# Patient Record
Sex: Female | Born: 1965 | Race: White | Hispanic: No | Marital: Married | Smoking: Never smoker
Health system: Southern US, Community
[De-identification: ages and names within clinical notes are randomized; demographics above are authoritative.]

## PROBLEM LIST (undated history)

## (undated) HISTORY — PX: BREAST LUMPECTOMY: SHX2

---

## 1998-03-02 ENCOUNTER — Inpatient Hospital Stay (HOSPITAL_COMMUNITY): Admission: AD | Admit: 1998-03-02 | Discharge: 1998-03-04 | Payer: Self-pay | Admitting: Obstetrics and Gynecology

## 2001-04-25 ENCOUNTER — Other Ambulatory Visit: Admission: RE | Admit: 2001-04-25 | Discharge: 2001-04-25 | Payer: Self-pay | Admitting: *Deleted

## 2001-08-09 ENCOUNTER — Other Ambulatory Visit: Admission: RE | Admit: 2001-08-09 | Discharge: 2001-08-09 | Payer: Self-pay | Admitting: *Deleted

## 2002-05-07 ENCOUNTER — Other Ambulatory Visit: Admission: RE | Admit: 2002-05-07 | Discharge: 2002-05-07 | Payer: Self-pay | Admitting: Obstetrics and Gynecology

## 2003-10-02 ENCOUNTER — Other Ambulatory Visit: Admission: RE | Admit: 2003-10-02 | Discharge: 2003-10-02 | Payer: Self-pay | Admitting: Obstetrics and Gynecology

## 2005-01-26 ENCOUNTER — Encounter: Admission: RE | Admit: 2005-01-26 | Discharge: 2005-01-26 | Payer: Self-pay | Admitting: Obstetrics and Gynecology

## 2005-01-26 ENCOUNTER — Encounter (INDEPENDENT_AMBULATORY_CARE_PROVIDER_SITE_OTHER): Payer: Self-pay | Admitting: *Deleted

## 2005-02-22 ENCOUNTER — Encounter (INDEPENDENT_AMBULATORY_CARE_PROVIDER_SITE_OTHER): Payer: Self-pay | Admitting: *Deleted

## 2005-02-22 ENCOUNTER — Ambulatory Visit (HOSPITAL_BASED_OUTPATIENT_CLINIC_OR_DEPARTMENT_OTHER): Admission: RE | Admit: 2005-02-22 | Discharge: 2005-02-22 | Payer: Self-pay | Admitting: General Surgery

## 2005-02-22 ENCOUNTER — Ambulatory Visit (HOSPITAL_COMMUNITY): Admission: RE | Admit: 2005-02-22 | Discharge: 2005-02-22 | Payer: Self-pay | Admitting: General Surgery

## 2006-02-08 ENCOUNTER — Encounter: Admission: RE | Admit: 2006-02-08 | Discharge: 2006-02-08 | Payer: Self-pay | Admitting: Obstetrics and Gynecology

## 2008-01-04 ENCOUNTER — Encounter: Admission: RE | Admit: 2008-01-04 | Discharge: 2008-01-04 | Payer: Self-pay | Admitting: Obstetrics and Gynecology

## 2008-01-15 ENCOUNTER — Encounter: Admission: RE | Admit: 2008-01-15 | Discharge: 2008-01-15 | Payer: Self-pay | Admitting: Obstetrics and Gynecology

## 2009-01-27 ENCOUNTER — Encounter: Admission: RE | Admit: 2009-01-27 | Discharge: 2009-01-27 | Payer: Self-pay | Admitting: Obstetrics and Gynecology

## 2009-08-06 ENCOUNTER — Encounter: Admission: RE | Admit: 2009-08-06 | Discharge: 2009-08-06 | Payer: Self-pay | Admitting: Obstetrics and Gynecology

## 2010-05-10 ENCOUNTER — Encounter: Admission: RE | Admit: 2010-05-10 | Discharge: 2010-05-10 | Payer: Self-pay | Admitting: Obstetrics and Gynecology

## 2010-10-22 NOTE — Op Note (Signed)
NAME:  Colleen Burke, Colleen Burke               ACCOUNT NO.:  [id number]   MEDICAL RECORD NO.:  [id number]          PATIENT TYPE:  AMB   LOCATION:  DSC                          FACILITY:  [facility]   PHYSICIAN:  Rose Phi. Maple Hudson, M.D.   DATE OF BIRTH:  April 27, 1966   DATE OF PROCEDURE:  02/22/2005  DATE OF DISCHARGE:                                 OPERATIVE REPORT   PREOPERATIVE DIAGNOSIS:  Phyllodes tumor of the left breast.   POSTOPERATIVE DIAGNOSIS:  Phyllodes tumor of the left breast.   OPERATION PERFORMED:  Excision of left breast mass.   SURGEON:  Rose Phi. Maple Hudson, M.D.   ANESTHESIA:  General.   DESCRIPTION OF PROCEDURE:  After suitable general anesthesia was induced,  the patient was placed in supine position with the arms extended on the arm  board.  A curved linear incision overlying the palpable mass was outlined  with a marking pencil and the area infiltrated with 0.25% Marcaine.  Incision was made and excision of the mass was carried out.  Because it was  a phyllodes tumor, I wanted a margin.  Hemostasis was obtained with the  cautery.  The deeper tissue was then approximated with 3-0 Vicryl and the  skin was closed with subcuticular 4-0 Monocryl with Steri-Strips.  Dressing  applied.  The patient was then transferred to the recovery room in  satisfactory condition having tolerated the procedure well.      Rose Phi. Maple Hudson, M.D.  Electronically Signed     PRY/MEDQ  D:  02/22/2005  T:  02/22/2005  Job:  [id number]

## 2013-08-20 ENCOUNTER — Other Ambulatory Visit: Payer: Self-pay | Admitting: Family Medicine

## 2013-08-20 ENCOUNTER — Other Ambulatory Visit (HOSPITAL_COMMUNITY)
Admission: RE | Admit: 2013-08-20 | Discharge: 2013-08-20 | Disposition: A | Discharge status: Home / Self Care | Payer: 59 | Source: Ambulatory Visit | Attending: Family Medicine | Admitting: Family Medicine

## 2013-08-20 DIAGNOSIS — N63 Unspecified lump in unspecified breast: Secondary | ICD-10-CM

## 2013-08-20 DIAGNOSIS — Z124 Encounter for screening for malignant neoplasm of cervix: Secondary | ICD-10-CM | POA: Insufficient documentation

## 2013-08-23 ENCOUNTER — Ambulatory Visit
Admission: RE | Admit: 2013-08-23 | Discharge: 2013-08-23 | Disposition: A | Discharge status: Home / Self Care | Payer: 59 | Source: Ambulatory Visit | Attending: Family Medicine | Admitting: Family Medicine

## 2013-08-23 ENCOUNTER — Ambulatory Visit
Admission: RE | Admit: 2013-08-23 | Discharge: 2013-08-23 | Disposition: A | Discharge status: Home / Self Care | Payer: Self-pay | Source: Ambulatory Visit | Attending: Family Medicine | Admitting: Family Medicine

## 2013-08-23 DIAGNOSIS — N63 Unspecified lump in unspecified breast: Secondary | ICD-10-CM

## 2013-10-14 ENCOUNTER — Other Ambulatory Visit: Payer: Self-pay | Admitting: Dermatology

## 2014-04-09 ENCOUNTER — Other Ambulatory Visit: Payer: Self-pay | Admitting: Obstetrics and Gynecology

## 2014-04-22 NOTE — Consult Note (Deleted)
NAME:  Colleen Burke, Colleen Burke NO.:  [id number]  MEDICAL RECORD NO.:  [id number]  LOCATION:  [facility]                         FACILITY:  [facility]  PHYSICIAN:  Lovenia Kim, M.D.DATE OF BIRTH:  1965-10-02  DATE OF CONSULTATION: DATE OF DISCHARGE:                                HISTORY AND PHYSICAL   CHIEF COMPLAINT:  Menorrhagia.  HISTORY OF PRESENT ILLNESS:  A 48 year old white female, G3, P2 who presents with aforementioned indications secondary to failed ablation.  PAST MEDICAL HISTORY:  Hypertension and colon cancer.  SOCIAL HISTORY:  Noncontributory.  SURGICAL HISTORY:  Benign lumpectomy, laser ablation of the cervix.  PHYSICAL EXAMINATION:  GENERAL:  Well-developed, well-nourished, white female, in no acute distress. HEENT:  Normal. NECK:  Supple.  Full range of motion. LUNGS:  Clear. HEART:  Regular rate and rhythm. ABDOMEN:  Soft, nontender. PELVIC:  Reveals the uterus to be bulky, anteflexed with no adnexal masses. EXTREMITIES:  There are no cords. NEUROLOGIC:  Nonfocal. SKIN:  Intact.  IMPRESSION:  Symptomatic menorrhagia.  PLAN:  Proceed with diagnostic hysteroscopy, D and C, NovaSure endometrial ablation.  Risks of anesthesia, infection, bleeding, injury to surrounding organs, and possible need for repair is discussed. Delayed versus immediate complications to include bowel and bladder injury noted.  The patient acknowledges and wishes to proceed.     Lovenia Kim, M.D.    RJT/MEDQ  D:  04/22/2014  T:  04/22/2014  Job:  [id number]

## 2014-04-23 ENCOUNTER — Ambulatory Visit (HOSPITAL_COMMUNITY): Payer: 59 | Admitting: Anesthesiology

## 2014-04-23 ENCOUNTER — Ambulatory Visit (HOSPITAL_COMMUNITY)
Admission: RE | Admit: 2014-04-23 | Discharge: 2014-04-23 | Disposition: A | Discharge status: Home / Self Care | Payer: 59 | Source: Ambulatory Visit | Attending: Obstetrics and Gynecology | Admitting: Obstetrics and Gynecology

## 2014-04-23 ENCOUNTER — Encounter (HOSPITAL_COMMUNITY): Payer: Self-pay | Admitting: *Deleted

## 2014-04-23 ENCOUNTER — Encounter (HOSPITAL_COMMUNITY)
Admission: RE | Disposition: A | Discharge status: Home / Self Care | Payer: Self-pay | Source: Ambulatory Visit | Attending: Obstetrics and Gynecology

## 2014-04-23 DIAGNOSIS — I10 Essential (primary) hypertension: Secondary | ICD-10-CM | POA: Insufficient documentation

## 2014-04-23 DIAGNOSIS — N920 Excessive and frequent menstruation with regular cycle: Secondary | ICD-10-CM | POA: Insufficient documentation

## 2014-04-23 DIAGNOSIS — Z85038 Personal history of other malignant neoplasm of large intestine: Secondary | ICD-10-CM | POA: Insufficient documentation

## 2014-04-23 DIAGNOSIS — D649 Anemia, unspecified: Secondary | ICD-10-CM | POA: Insufficient documentation

## 2014-04-23 DIAGNOSIS — D259 Leiomyoma of uterus, unspecified: Secondary | ICD-10-CM | POA: Insufficient documentation

## 2014-04-23 HISTORY — PX: DILITATION & CURRETTAGE/HYSTROSCOPY WITH NOVASURE ABLATION: SHX5568

## 2014-04-23 LAB — CBC
HCT: 39.2 % (ref 36.0–46.0)
Hemoglobin: 13.1 g/dL (ref 12.0–15.0)
MCH: 31.9 pg (ref 26.0–34.0)
MCHC: 33.4 g/dL (ref 30.0–36.0)
MCV: 95.4 fL (ref 78.0–100.0)
PLATELETS: 308 10*3/uL (ref 150–400)
RBC: 4.11 MIL/uL (ref 3.87–5.11)
RDW: 12.9 % (ref 11.5–15.5)
WBC: 5.5 10*3/uL (ref 4.0–10.5)

## 2014-04-23 SURGERY — DILATATION & CURETTAGE/HYSTEROSCOPY WITH NOVASURE ABLATION
Anesthesia: General | Site: Vagina

## 2014-04-23 MED ORDER — BUPIVACAINE HCL (PF) 0.25 % IJ SOLN
INTRAMUSCULAR | Status: DC | PRN
  Administered 2014-04-23: 20 mL

## 2014-04-23 MED ORDER — KETOROLAC TROMETHAMINE 30 MG/ML IJ SOLN: 15.0000 mg | Freq: Once | INTRAMUSCULAR | Status: DC | PRN

## 2014-04-23 MED ORDER — PROPOFOL 10 MG/ML IV EMUL
INTRAVENOUS | Status: AC
  Filled 2014-04-23: qty 20

## 2014-04-23 MED ORDER — DEXAMETHASONE SODIUM PHOSPHATE 10 MG/ML IJ SOLN
INTRAMUSCULAR | Status: DC | PRN
  Administered 2014-04-23: 4 mg via INTRAVENOUS

## 2014-04-23 MED ORDER — BUPIVACAINE HCL (PF) 0.25 % IJ SOLN
INTRAMUSCULAR | Status: AC
  Filled 2014-04-23: qty 30

## 2014-04-23 MED ORDER — MIDAZOLAM HCL 2 MG/2ML IJ SOLN
INTRAMUSCULAR | Status: AC
  Filled 2014-04-23: qty 2

## 2014-04-23 MED ORDER — LIDOCAINE HCL (CARDIAC) 20 MG/ML IV SOLN
INTRAVENOUS | Status: AC
  Filled 2014-04-23: qty 5

## 2014-04-23 MED ORDER — SCOPOLAMINE 1 MG/3DAYS TD PT72
MEDICATED_PATCH | TRANSDERMAL | Status: AC
  Administered 2014-04-23: 1.5 mg via TRANSDERMAL
  Filled 2014-04-23: qty 1

## 2014-04-23 MED ORDER — LACTATED RINGERS IV SOLN
INTRAVENOUS | Status: DC
  Administered 2014-04-23 (×2): via INTRAVENOUS

## 2014-04-23 MED ORDER — PROPOFOL 10 MG/ML IV BOLUS
INTRAVENOUS | Status: DC | PRN
  Administered 2014-04-23: 170 mg via INTRAVENOUS

## 2014-04-23 MED ORDER — LIDOCAINE HCL (CARDIAC) 20 MG/ML IV SOLN
INTRAVENOUS | Status: DC | PRN
  Administered 2014-04-23: 70 mg via INTRAVENOUS

## 2014-04-23 MED ORDER — KETOROLAC TROMETHAMINE 30 MG/ML IJ SOLN
INTRAMUSCULAR | Status: AC
  Filled 2014-04-23: qty 1

## 2014-04-23 MED ORDER — ONDANSETRON HCL 4 MG/2ML IJ SOLN
INTRAMUSCULAR | Status: DC | PRN
  Administered 2014-04-23: 4 mg via INTRAVENOUS

## 2014-04-23 MED ORDER — FENTANYL CITRATE 0.05 MG/ML IJ SOLN
INTRAMUSCULAR | Status: DC | PRN
  Administered 2014-04-23 (×2): 50 ug via INTRAVENOUS

## 2014-04-23 MED ORDER — ONDANSETRON HCL 4 MG/2ML IJ SOLN
INTRAMUSCULAR | Status: AC
  Filled 2014-04-23: qty 2

## 2014-04-23 MED ORDER — FENTANYL CITRATE 0.05 MG/ML IJ SOLN
INTRAMUSCULAR | Status: AC
  Filled 2014-04-23: qty 2

## 2014-04-23 MED ORDER — KETOROLAC TROMETHAMINE 30 MG/ML IJ SOLN
INTRAMUSCULAR | Status: DC | PRN
  Administered 2014-04-23: 30 mg via INTRAVENOUS

## 2014-04-23 MED ORDER — FENTANYL CITRATE 0.05 MG/ML IJ SOLN: 25.0000 ug | INTRAMUSCULAR | Status: DC | PRN

## 2014-04-23 MED ORDER — SCOPOLAMINE 1 MG/3DAYS TD PT72
1.0000 | MEDICATED_PATCH | Freq: Once | TRANSDERMAL | Status: DC
  Administered 2014-04-23: 1.5 mg via TRANSDERMAL

## 2014-04-23 MED ORDER — DEXAMETHASONE SODIUM PHOSPHATE 4 MG/ML IJ SOLN
INTRAMUSCULAR | Status: AC
  Filled 2014-04-23: qty 1

## 2014-04-23 MED ORDER — EPHEDRINE SULFATE 50 MG/ML IJ SOLN
INTRAMUSCULAR | Status: DC | PRN
  Administered 2014-04-23: 10 mg via INTRAVENOUS

## 2014-04-23 MED ORDER — MIDAZOLAM HCL 2 MG/2ML IJ SOLN
INTRAMUSCULAR | Status: DC | PRN
  Administered 2014-04-23: 1 mg via INTRAVENOUS

## 2014-04-23 MED ORDER — LACTATED RINGERS IR SOLN
Status: DC | PRN
  Administered 2014-04-23: 3000 mL

## 2014-04-23 MED ORDER — MEPERIDINE HCL 25 MG/ML IJ SOLN: 6.2500 mg | INTRAMUSCULAR | Status: DC | PRN

## 2014-04-23 MED ORDER — CEFAZOLIN SODIUM-DEXTROSE 2-3 GM-% IV SOLR
INTRAVENOUS | Status: AC
  Filled 2014-04-23: qty 50

## 2014-04-23 MED ORDER — OXYCODONE-ACETAMINOPHEN 5-325 MG PO TABS: 1.0000 | ORAL_TABLET | ORAL | Status: DC | PRN

## 2014-04-23 MED ORDER — CEFAZOLIN SODIUM-DEXTROSE 2-3 GM-% IV SOLR
2.0000 g | INTRAVENOUS | Status: AC
  Administered 2014-04-23: 2 g via INTRAVENOUS

## 2014-04-23 MED ORDER — ONDANSETRON HCL 4 MG/2ML IJ SOLN: 4.0000 mg | Freq: Once | INTRAMUSCULAR | Status: DC | PRN

## 2014-04-23 SURGICAL SUPPLY — 13 items
ABLATOR ENDOMETRIAL BIPOLAR (ABLATOR) ×2 IMPLANT
CATH ROBINSON RED A/P 16FR (CATHETERS) ×2 IMPLANT
CLOTH BEACON ORANGE TIMEOUT ST (SAFETY) ×2 IMPLANT
CONTAINER PREFILL 10% NBF 60ML (FORM) ×2 IMPLANT
GLOVE BIO SURGEON STRL SZ7.5 (GLOVE) ×4 IMPLANT
GOWN STRL REUS W/TWL LRG LVL3 (GOWN DISPOSABLE) ×4 IMPLANT
PACK VAGINAL MINOR WOMEN LF (CUSTOM PROCEDURE TRAY) ×2 IMPLANT
PAD OB MATERNITY 4.3X12.25 (PERSONAL CARE ITEMS) ×2 IMPLANT
PAD PREP 24X48 CUFFED NSTRL (MISCELLANEOUS) ×2 IMPLANT
SET TUBING HYSTEROSCOPY 2 NDL (TUBING) ×2 IMPLANT
SYR TB 1ML 25GX5/8 (SYRINGE) IMPLANT
TOWEL OR 17X24 6PK STRL BLUE (TOWEL DISPOSABLE) ×4 IMPLANT
WATER STERILE IRR 1000ML POUR (IV SOLUTION) ×2 IMPLANT

## 2014-04-23 NOTE — Transfer of Care (Signed)
Immediate Anesthesia Transfer of Care Note  Patient: Colleen Burke  Procedure(s) Performed: Procedure(s): DILATATION & CURETTAGE/HYSTEROSCOPY WITH NOVASURE ABLATION (N/A)  Patient Location: PACU  Anesthesia Type:General  Level of Consciousness: awake, alert , oriented and patient cooperative  Airway & Oxygen Therapy: Patient Spontanous Breathing and Patient connected to nasal cannula oxygen  Post-op Assessment: Report given to PACU RN and Post -op Vital signs reviewed and stable  Post vital signs: Reviewed and stable  Complications: No apparent anesthesia complications

## 2014-04-23 NOTE — Op Note (Signed)
04/23/2014  10:48 AM  PATIENT:  Colleen Burke  48 y.o. female  PRE-OPERATIVE DIAGNOSIS:  Menorrhagia, Anemia  POST-OPERATIVE DIAGNOSIS:  Menorrhagia, Anemia  PROCEDURE:  Procedure(s): DILATATION & CURETTAGE/HYSTEROSCOPY WITH NOVASURE ABLATION  SURGEON:  Surgeon(s): Lovenia Kim, MD  ASSISTANTS: none   ANESTHESIA:   local and general  ESTIMATED BLOOD LOSS: minimal   DRAINS: none   LOCAL MEDICATIONS USED:  MARCAINE    and Amount: 20 ml  SPECIMEN:  Source of Specimen:  [facility]  DISPOSITION OF SPECIMEN:  PATHOLOGY  COUNTS:  YES  DICTATION #: [id number]  PLAN OF CARE: Dc homje  PATIENT DISPOSITION:  PACU - hemodynamically stable.

## 2014-04-23 NOTE — Anesthesia Preprocedure Evaluation (Signed)
Anesthesia Evaluation    Reviewed: Allergy & Precautions, H&P , NPO status , Patient's Chart, lab work & pertinent test results  Airway Mallampati: I  TM Distance: >3 FB Neck ROM: full    Dental no notable dental hx. (+) Teeth Intact   Pulmonary neg pulmonary ROS,    Pulmonary exam normal       Cardiovascular negative cardio ROS  Rhythm:regular     Neuro/Psych negative neurological ROS  negative psych ROS   GI/Hepatic negative GI ROS, Neg liver ROS,   Endo/Other  negative endocrine ROS  Renal/GU negative Renal ROS     Musculoskeletal   Abdominal Normal abdominal exam  (+)   Peds  Hematology negative hematology ROS (+)   Anesthesia Other Findings   Reproductive/Obstetrics negative OB ROS                             Anesthesia Physical Anesthesia Plan  ASA: II  Anesthesia Plan: General   Post-op Pain Management:    Induction: Intravenous  Airway Management Planned: LMA  Additional Equipment:   Intra-op Plan:   Post-operative Plan:   Informed Consent: I have reviewed the patients History and Physical, chart, labs and discussed the procedure including the risks, benefits and alternatives for the proposed anesthesia with the patient or authorized representative who has indicated his/her understanding and acceptance.   Dental Advisory Given  Plan Discussed with: CRNA and Surgeon  Anesthesia Plan Comments:         Anesthesia Quick Evaluation

## 2014-04-23 NOTE — H&P (Signed)
NAME:  Colleen Burke, Colleen Burke NO.:  [id number]  MEDICAL RECORD NO.:  [id number]  LOCATION:  [facility]                         FACILITY:  [facility]  PHYSICIAN:  Lovenia Kim, M.D.DATE OF BIRTH:  [birth year]  DATE OF ADMISSION:  03/14/2014 DATE OF DISCHARGE:                             HISTORY & PHYSICAL   CHIEF COMPLAINT:  Menorrhagia.  HISTORY OF PRESENT ILLNESS:  A 48 year old white female, G3, P2 who presents with aforementioned indications secondary to failed ablation.  PAST MEDICAL HISTORY:  Hypertension and colon cancer.  SOCIAL HISTORY:  Noncontributory __________.  SURGICAL HISTORY:  Benign lumpectomy, laser ablation of the cervix.  PHYSICAL EXAMINATION:  GENERAL:  Well-developed, well-nourished, white female, in no acute distress. HEENT:  Normal. NECK:  Supple.  Full range of motion. LUNGS:  Clear. HEART:  Regular rate and rhythm. ABDOMEN:  Soft, nontender. PELVIC:  Reveals the uterus to be bulky, anteflexed with no adnexal masses. EXTREMITIES:  There are no cords. NEUROLOGIC:  Nonfocal. SKIN:  Intact.  IMPRESSION:  Symptomatic menorrhagia.  PLAN:  Proceed with diagnostic hysteroscopy, D and C, NovaSure endometrial ablation.  Risks of anesthesia, infection, bleeding, injury to surrounding organs, and possible need for repair is discussed. Delayed versus immediate complications to include bowel and bladder injury noted.  The patient acknowledges and wishes to proceed.     Lovenia Kim, M.D.    RJT/MEDQ  D:  04/22/2014  T:  04/22/2014  Job:  [id number]

## 2014-04-23 NOTE — Progress Notes (Signed)
Patient ID: Colleen Burke, female   DOB: 1965/11/04, 48 y.o.   MRN: [id number] Patient seen and examined. Consent witnessed and signed. No changes noted. Update completed. CBC    Component Value Date/Time   WBC 5.5 04/23/2014 0925   RBC 4.11 04/23/2014 0925   HGB 13.1 04/23/2014 0925   HCT 39.2 04/23/2014 0925   PLT 308 04/23/2014 0925   MCV 95.4 04/23/2014 0925   MCH 31.9 04/23/2014 0925   MCHC 33.4 04/23/2014 0925   RDW 12.9 04/23/2014 0925

## 2014-04-23 NOTE — Op Note (Signed)
NAME:  Colleen Burke, Colleen Burke NO.:  [id number]  MEDICAL RECORD NO.:  [id number]  LOCATION:  [facility]                          FACILITY:  [facility]  PHYSICIAN:  Lovenia Kim, M.D.DATE OF BIRTH:  May 27, 1966  DATE OF PROCEDURE: DATE OF DISCHARGE:                              OPERATIVE REPORT   PREOPERATIVE DIAGNOSES: 1. Refractory menorrhagia. 2. Uterine fibroid. 3. Secondary anemia.  POSTOPERATIVE DIAGNOSES: 1. Refractory menorrhagia. 2. Uterine fibroid. 3. Secondary anemia.  PROCEDURE: 1. Diagnostic hysteroscopy. 2. Dilation and curettage. 3. NovaSure endometrial ablation.  SURGEON:  Brien Few, M.D.  ASSISTANT:  None.  ANESTHESIA:  General and local.  ESTIMATED BLOOD LOSS:  Less than 50 mL.  FLUID DEFICIT:  150 mL.  COMPLICATIONS:  None.  DRAINS:  None.  COUNTS:  Correct.  DISPOSITION:  The patient to recovery in good condition.  SPECIMEN:  Endometrial curettings to pathology.  BRIEF OPERATIVE NOTE:  After being apprised of risks of anesthesia, infection, bleeding, injury to surrounding organs, possible need for repair, delayed versus immediate complications to include bowel and bladder injury, possible need for repair, the patient was brought to the operating room where she was administered general anesthetic without complications.  Prepped and draped in usual sterile fashion. Catheterized until the bladder was empty.  Exam under anesthesia revealed a anteflexed irregularly shaped known fibroid uterus.  No adnexal masses.  At this time, a dilute Marcaine solution was placed, 20 mL paracervical block and cervix was easily dilated up to a #21 Pratt dilator.  Hysteroscope placed.  Visualization reveals an otherwise normal endometrial cavity with evidence of fibroid bulging on the anterior portion, but no entry into the cavity itself.  Bilateral normal tubal ostia and no evidence of endometrial pathology.  D and C was performed using sharp  curettage in a 4-quadrant method.  NovaSure device was placed and seated in the standard fashion to a length of 6.5, a width of 4.4, and initiated after negative CO2 test to a wattage of 157 watts for 50 seconds.  Without difficulty, the device was removed, inspected, and found to be intact.  Revisualization of the endometrial cavity revealed a well ablated global endometrial ablation.  No evidence of uterine perforation.  All instruments were removed.  There was a small posterior cervical ulceration from tenaculum placement.  This was stitched using an interrupted 2-0 Vicryl suture otherwise good hemostasis noted.  The patient tolerated the procedure well, was awakened and transferred to recovery in good condition.     Lovenia Kim, M.D.     RJT/MEDQ  D:  04/23/2014  T:  04/23/2014  Job:  [id number]

## 2014-04-23 NOTE — Discharge Instructions (Signed)

## 2014-04-24 ENCOUNTER — Encounter (HOSPITAL_COMMUNITY): Payer: Self-pay | Admitting: Obstetrics and Gynecology

## 2014-04-24 NOTE — Anesthesia Postprocedure Evaluation (Signed)
Anesthesia Post Note  Patient: Colleen Burke  Procedure(s) Performed: Procedure(s) (LRB): DILATATION & CURETTAGE/HYSTEROSCOPY WITH NOVASURE ABLATION (N/A)  Anesthesia type: General  Patient location: PACU  Post pain: Pain level controlled  Post assessment: Post-op Vital signs reviewed  Last Vitals:  Filed Vitals:   04/23/14 1200  BP: 127/83  Pulse: 50  Temp: 36.6 C  Resp: 16    Post vital signs: Reviewed  Level of consciousness: sedated  Complications: No apparent anesthesia complications

## 2015-10-05 DIAGNOSIS — L573 Poikiloderma of Civatte: Secondary | ICD-10-CM | POA: Diagnosis not present

## 2015-10-05 DIAGNOSIS — D225 Melanocytic nevi of trunk: Secondary | ICD-10-CM | POA: Diagnosis not present

## 2015-10-05 DIAGNOSIS — L821 Other seborrheic keratosis: Secondary | ICD-10-CM | POA: Diagnosis not present

## 2015-10-05 DIAGNOSIS — L719 Rosacea, unspecified: Secondary | ICD-10-CM | POA: Diagnosis not present

## 2015-10-05 DIAGNOSIS — L710 Perioral dermatitis: Secondary | ICD-10-CM | POA: Diagnosis not present

## 2015-10-05 DIAGNOSIS — D485 Neoplasm of uncertain behavior of skin: Secondary | ICD-10-CM | POA: Diagnosis not present

## 2015-10-05 DIAGNOSIS — D2272 Melanocytic nevi of left lower limb, including hip: Secondary | ICD-10-CM | POA: Diagnosis not present

## 2016-02-29 DIAGNOSIS — S86811A Strain of other muscle(s) and tendon(s) at lower leg level, right leg, initial encounter: Secondary | ICD-10-CM | POA: Diagnosis not present

## 2016-03-14 ENCOUNTER — Ambulatory Visit (INDEPENDENT_AMBULATORY_CARE_PROVIDER_SITE_OTHER): Payer: BLUE CROSS/BLUE SHIELD | Admitting: Sports Medicine

## 2016-03-14 DIAGNOSIS — S86811D Strain of other muscle(s) and tendon(s) at lower leg level, right leg, subsequent encounter: Secondary | ICD-10-CM

## 2016-03-28 ENCOUNTER — Ambulatory Visit (INDEPENDENT_AMBULATORY_CARE_PROVIDER_SITE_OTHER): Payer: BLUE CROSS/BLUE SHIELD | Admitting: Sports Medicine

## 2016-03-28 ENCOUNTER — Inpatient Hospital Stay (INDEPENDENT_AMBULATORY_CARE_PROVIDER_SITE_OTHER): Payer: BLUE CROSS/BLUE SHIELD

## 2016-03-28 VITALS — BP 133/92 | HR 53 | Ht 62.5 in | Wt 155.0 lb

## 2016-03-28 DIAGNOSIS — S86811D Strain of other muscle(s) and tendon(s) at lower leg level, right leg, subsequent encounter: Secondary | ICD-10-CM

## 2016-03-28 NOTE — Progress Notes (Signed)
Office Visit Note   Patient: Colleen Burke           Date of Birth: [birth year]           MRN: [id number] Visit Date: 03/28/2016  PCP: Reginia Naas, MD  Assessment & Plan: Visit Diagnoses:  1. Strain of calf muscle, right, subsequent encounter    Plan: Doing well. Progress to out of boot around the house.  Stay in immobilization for an additional 2 weeks when active. Repeat US at f/u and consider referal to PT at that time with addition of Alfredson Exercises. Continue Nitro Protocol  Follow-Up Instructions: Return in about 2 weeks (around 04/11/2016) for repeat ultrasound.   Orders:  Orders Placed This Encounter  Procedures  . [place] Extrem Low Right Ltd   Meds ordered this encounter  Medications  . nitroGLYCERIN (NITRODUR - DOSED IN MG/24 HR) 0.2 mg/hr patch    Refill:  0    Procedures: No procedures performed  Clinical Data: Findings:  Calf injury on 03/02/16 while playing tennis.    Subjective: Chief Complaint  Patient presents with  . Follow-up    Right calf- Pt. wearing Fracture boot, Pt. states about the same   Patient improving. Persistent moderate pain when out of the fracture boot has been compliant with this. She denies any significant pain out of proportion. No significant nighttime awakenings. She is interested in transitioning to a PRAFO that can be provided by her friend who is an Customer service manager. She is not having any adverse effects to the nitroglycerin protocol. Continuing to use body helix compression sleeve & fracture boot at this time.   Review of Systems  Constitutional: Negative.        Otherwise per HPI   Objective: Vital Signs: BP (!) 133/92   Pulse (!) 53   Ht 5' 2.5" (1.588 m)   Wt 155 lb (70.3 kg)   BMI 27.90 kg/m   Physical Exam  Constitutional: No distress.  HENT:  Head: Normocephalic and atraumatic.  Pulmonary/Chest: Effort normal. No respiratory distress.  Musculoskeletal:  Right calf is overall well aligned. She has a  persistent area of tenderness over the medial head of the gastroc. Plantar flexion strength is intact.  No significant bruising swelling or tenderness to palpation along the insertion of the Achilles.  Neurological: She is alert.  Appropriately interactive.  Skin: Skin is warm and dry. No rash noted. She is not diaphoretic. No erythema. No pallor.  Psychiatric: Judgment normal.   Ortho Exam  Specialty Comments:  No specialty comments available.  Imaging: [place] Extrem Low Right Ltd  Result Date: 03/28/2016   Limited MSK Ultrasound of right calf: Findings: Persistent area of hypoechoic change within the musculotendinous junction. More heterogeneous & in the past. Measurements obtained.  Impression: The above findings are consistent with Musculotendinous strain of the medial head of the gastroc, healing as expected.     PMFS History: There are no active problems to display for this patient.  No past medical history on file.  No family history on file.  Past Surgical History:  Procedure Laterality Date  . BREAST LUMPECTOMY     2006  . DILITATION & CURRETTAGE/HYSTROSCOPY WITH NOVASURE ABLATION N/A 04/23/2014   Procedure: DILATATION & CURETTAGE/HYSTEROSCOPY WITH NOVASURE ABLATION;  Surgeon: Lovenia Kim, MD;  Location: [facility];  Service: Gynecology;  Laterality: N/A;   Social History   Occupational History  . Not on file.   Social History Main Topics  . Smoking status: Never Smoker  .  Smokeless tobacco: Never Used  . Alcohol use No  . Drug use: No  . Sexual activity: Not on file

## 2016-04-11 ENCOUNTER — Ambulatory Visit (INDEPENDENT_AMBULATORY_CARE_PROVIDER_SITE_OTHER): Payer: BLUE CROSS/BLUE SHIELD | Admitting: Sports Medicine

## 2016-04-11 ENCOUNTER — Inpatient Hospital Stay (INDEPENDENT_AMBULATORY_CARE_PROVIDER_SITE_OTHER): Payer: Self-pay

## 2016-04-11 ENCOUNTER — Encounter (INDEPENDENT_AMBULATORY_CARE_PROVIDER_SITE_OTHER): Payer: Self-pay | Admitting: Sports Medicine

## 2016-04-11 VITALS — BP 125/86 | HR 68 | Ht 62.5 in | Wt 155.0 lb

## 2016-04-11 DIAGNOSIS — S86111D Strain of other muscle(s) and tendon(s) of posterior muscle group at lower leg level, right leg, subsequent encounter: Secondary | ICD-10-CM

## 2016-04-11 NOTE — Patient Instructions (Signed)

## 2016-04-11 NOTE — Progress Notes (Signed)
Colleen Burke - 50 y.o. female MRN [id number]  Date of birth: 1966-02-18  Office Visit Note: Visit Date: 04/11/2016 PCP: Colleen Naas, MD Referred by: Colleen Ada, MD  Subjective: Chief Complaint  Patient presents with  . Right Leg - Pain  . Follow-up    Right calf   HPI: Patient states feeling better.  Wearing fracture boot.    6 weeks out from initial injury while playing tennis. She has been performing gentle range of motion. Using the fracture boot while ambulating but has been out of it & feeling better. She has not been as compliant with the nitroglycerin protocol is in the past.    ROS Otherwise per HPI.  Assessment & Plan: Visit Diagnoses:  1. Rupture of medial head of right gastrocnemius, subsequent encounter     Plan: Findings:  Overall healing well. Continue with gentle range of motion in resumption of normal activities. If any lack of improvement will consider formal referral to physical therapy if she calls to request this I'm happy to have her do this at her convenience.    Meds & Orders: No orders of the defined types were placed in this encounter.   Orders Placed This Encounter  Procedures  . [place] Extrem Low Right Ltd    Follow-up: Return in about 3 weeks (around 05/02/2016) for repeat diagnostic ultrasound.   Procedures: No procedures performed  No notes on file   Clinical History: No specialty comments available.  She reports that she has never smoked. She has never used smokeless tobacco. No results for input(s): HGBA1C, LABURIC in the last 8760 hours.  Objective:  VS:  HT:5' 2.5" (158.8 cm)   WT:155 lb (70.3 kg)  BMI:28    BP:125/86  HR:68bpm  TEMP: ( )  RESP:  Physical Exam  Constitutional: She appears well-developed and well-nourished. No distress.  HENT:  Head: Normocephalic and atraumatic.  Pulmonary/Chest: Effort normal. No respiratory distress.  Musculoskeletal:  Right calf is overall well line. No significant swelling  today. Small palpable defect is remains. Ankle dorsiflexion & plantarflexion is intact.  Neurological: She is alert.  Appropriately interactive.  Skin: Skin is warm and dry. No rash noted. She is not diaphoretic. No erythema. No pallor.  Psychiatric: She has a normal mood and affect. Her behavior is normal. Judgment and thought content normal.    Ortho Exam Imaging: [place] Extrem Low Right Ltd  Result Date: 04/11/2016 Findings: Rupture of the medial head of the gastroc with an intact solely is & Achilles tendon. The hypoechoic change is consolidating & measures smaller than in the past at approximately 0.5 cm x 3.5 cm. No significant neovascularity appreciated today but patient has not been wearing a nitroglycerin patch for the past 3 days. Impression: Rupture of the medial head of the gastroc, healing   Past Medical/Family/Surgical/Social History: Medications & Allergies reviewed per EMR There are no active problems to display for this patient.  No past medical history on file. No family history on file. Past Surgical History:  Procedure Laterality Date  . BREAST LUMPECTOMY     2006  . DILITATION & CURRETTAGE/HYSTROSCOPY WITH NOVASURE ABLATION N/A 04/23/2014   Procedure: DILATATION & CURETTAGE/HYSTEROSCOPY WITH NOVASURE ABLATION;  Surgeon: Lovenia Kim, MD;  Location: [facility];  Service: Gynecology;  Laterality: N/A;   Social History   Occupational History  . Not on file.   Social History Main Topics  . Smoking status: Never Smoker  . Smokeless tobacco: Never Used  . Alcohol use  No  . Drug use: No  . Sexual activity: Not on file

## 2016-05-04 ENCOUNTER — Ambulatory Visit (INDEPENDENT_AMBULATORY_CARE_PROVIDER_SITE_OTHER): Payer: BLUE CROSS/BLUE SHIELD | Admitting: Sports Medicine

## 2016-05-06 ENCOUNTER — Inpatient Hospital Stay (INDEPENDENT_AMBULATORY_CARE_PROVIDER_SITE_OTHER): Payer: Self-pay

## 2016-05-06 ENCOUNTER — Encounter (INDEPENDENT_AMBULATORY_CARE_PROVIDER_SITE_OTHER): Payer: Self-pay | Admitting: Sports Medicine

## 2016-05-06 ENCOUNTER — Ambulatory Visit (INDEPENDENT_AMBULATORY_CARE_PROVIDER_SITE_OTHER): Payer: BLUE CROSS/BLUE SHIELD | Admitting: Sports Medicine

## 2016-05-06 VITALS — BP 115/81 | HR 66 | Ht 62.0 in | Wt 155.0 lb

## 2016-05-06 DIAGNOSIS — S86811D Strain of other muscle(s) and tendon(s) at lower leg level, right leg, subsequent encounter: Secondary | ICD-10-CM

## 2016-05-06 DIAGNOSIS — S86111D Strain of other muscle(s) and tendon(s) of posterior muscle group at lower leg level, right leg, subsequent encounter: Secondary | ICD-10-CM | POA: Diagnosis not present

## 2016-05-06 NOTE — Patient Instructions (Signed)
I am transferring practices as of January 1st  to [facility] at [place].  This is a great opportunity & I am saddened to be leaving [facility] however & excited for new opportunities. I will continue to be seeing patients at [facility] through the end of December. I am happy to see you at the new location but also am confident that you are in great hands with the excellent providers here at [facility].  We are not currently scheduling patients at the new location at this time but if you look on [facility]'s website a contact information should be available there closer to January. Additionally [email or web address] will have information when it becomes available.    The telephone number will be [phone]  - Nobody will be answering this phone number until closer to January. 

## 2016-05-08 NOTE — Progress Notes (Signed)
BRIGGS LYERLY - 50 y.o. female MRN [id number]  Date of birth: 08-Jan-1966  Office Visit Note: Visit Date: 05/06/2016 PCP: Reginia Naas, MD Referred by: Carol Ada, MD  Subjective: Chief Complaint  Patient presents with  . Right Leg - Follow-up  . Follow-up    Patient states right leg is feeling better.   HPI: 4 week follow-up for "Tennis Leg" after sustaining an injury in early October. She is reporting feeling quite well. She has been out of the boot but has not resumed any aggressive activity but was able to walk 3 miles without significant discomfort. She has continued using nitroglycerin patch with no adverse effects. She denies any new or different symptoms. She is hoping to return to tennis by February. ROS: No nighttime awakenings. Not requiring any additional pain medications. Using body helix compression sleeve.. Otherwise per HPI.   Clinical History: No specialty comments available.  She reports that she has never smoked. She has never used smokeless tobacco.  No results for input(s): HGBA1C, LABURIC in the last 8760 hours.  Assessment & Plan: Visit Diagnoses:    ICD-9-CM ICD-10-CM   1. Rupture of medial head of right gastrocnemius, subsequent encounter V58.89 S86.111D [place] Extrem Low Right Ltd   844.8    2. Strain of calf muscle, right, subsequent encounter V58.89 S86.811D [place] Extrem Low Right Ltd   844.8      Plan: This is healing as expected. We did discuss the this will continue to take some time to finish healing & recommended staining from any type of explosive activities for the next several weeks but after Christmas time it is okay for her to begin working on gentle return to tenderness. She should continue wearing body helix compression sleeve as well as using a nitroglycerin protocol as a ultrasound does show moderate healing but not complete. Alfredson exercises emphasized. Follow-up: Return in about 8 weeks (around 07/01/2016) for at my new location..    Meds: No orders of the defined types were placed in this encounter.  Procedures: No notes on file   Objective:  VS:  HT:5\' 2"  (157.5 cm)   WT:155 lb (70.3 kg)  BMI:28.4    BP:115/81  HR:66bpm  TEMP: ( )  RESP:  Physical Exam: Adult female. Alert & appropriate. No acute distress. Lower extremities are overall well aligned with no significant deformity. No significant swelling. DP & PT pulses 2+/4.  No significant bruising/ecchymosis or erythema the skin. RIGHT calf: Well aligned. Small palpable defect at the medial head of the gastroc. No pain. Ankle dorsiflexion & plantar flexion intact.  Imaging: [place] Extrem Low Right Ltd  Result Date: 05/08/2016 Limited MSK ultrasound of right lower leg: Images obtained today and interpreted by myself. Findings:  Musculotendinous junction with evidence of healing medial gastroc strain. The previous hypoechoic change has significantly improved & has improved echotexture. There is an appropriate amount of neovascularity & no peritendinous changes or peritenon fluid. Achilles tendon is intact & normal appearing. Impression: Medial head of the gastroc rupture with appropriate healing.  [place] Extrem Low Right Ltd  Result Date: 04/11/2016 Findings: Rupture of the medial head of the gastroc with an intact solely is & Achilles tendon. The hypoechoic change is consolidating & measures smaller than in the past at approximately 0.5 cm x 3.5 cm. No significant neovascularity appreciated today but patient has not been wearing a nitroglycerin patch for the past 3 days. Impression: Rupture of the medial head of the gastroc, healing   Past Medical/Family/Surgical/Social  History: Medications & Allergies reviewed per EMR There are no active problems to display for this patient.  No past medical history on file. No family history on file. Past Surgical History:  Procedure Laterality Date  . BREAST LUMPECTOMY     2006  . DILITATION & CURRETTAGE/HYSTROSCOPY WITH  NOVASURE ABLATION N/A 04/23/2014   Procedure: DILATATION & CURETTAGE/HYSTEROSCOPY WITH NOVASURE ABLATION;  Surgeon: Lovenia Kim, MD;  Location: [facility];  Service: Gynecology;  Laterality: N/A;   Social History   Occupational History  . Not on file.   Social History Main Topics  . Smoking status: Never Smoker  . Smokeless tobacco: Never Used  . Alcohol use No  . Drug use: No  . Sexual activity: Not on file

## 2017-04-01 DIAGNOSIS — R0602 Shortness of breath: Secondary | ICD-10-CM | POA: Diagnosis not present

## 2017-04-01 DIAGNOSIS — J45901 Unspecified asthma with (acute) exacerbation: Secondary | ICD-10-CM | POA: Diagnosis not present

## 2017-04-01 DIAGNOSIS — R05 Cough: Secondary | ICD-10-CM | POA: Diagnosis not present

## 2017-04-29 DIAGNOSIS — J209 Acute bronchitis, unspecified: Secondary | ICD-10-CM | POA: Diagnosis not present

## 2017-04-29 DIAGNOSIS — M940 Chondrocostal junction syndrome [Tietze]: Secondary | ICD-10-CM | POA: Diagnosis not present

## 2018-04-25 DIAGNOSIS — D225 Melanocytic nevi of trunk: Secondary | ICD-10-CM | POA: Diagnosis not present

## 2018-04-25 DIAGNOSIS — L814 Other melanin hyperpigmentation: Secondary | ICD-10-CM | POA: Diagnosis not present

## 2018-04-25 DIAGNOSIS — L7211 Pilar cyst: Secondary | ICD-10-CM | POA: Diagnosis not present

## 2018-04-25 DIAGNOSIS — L821 Other seborrheic keratosis: Secondary | ICD-10-CM | POA: Diagnosis not present

## 2019-03-05 ENCOUNTER — Other Ambulatory Visit: Payer: Self-pay | Admitting: Family Medicine

## 2019-03-05 ENCOUNTER — Other Ambulatory Visit (HOSPITAL_COMMUNITY)
Admission: RE | Admit: 2019-03-05 | Discharge: 2019-03-05 | Disposition: A | Discharge status: Home / Self Care | Payer: Self-pay | Source: Ambulatory Visit | Attending: Family Medicine | Admitting: Family Medicine

## 2019-03-05 DIAGNOSIS — Z124 Encounter for screening for malignant neoplasm of cervix: Secondary | ICD-10-CM | POA: Insufficient documentation

## 2019-03-08 ENCOUNTER — Other Ambulatory Visit: Payer: Self-pay | Admitting: Family Medicine

## 2019-03-08 DIAGNOSIS — R1900 Intra-abdominal and pelvic swelling, mass and lump, unspecified site: Secondary | ICD-10-CM

## 2019-03-11 LAB — CYTOLOGY - PAP
Adequacy: ABSENT
Diagnosis: NEGATIVE
High risk HPV: POSITIVE — AB

## 2019-03-18 ENCOUNTER — Ambulatory Visit
Admission: RE | Admit: 2019-03-18 | Discharge: 2019-03-18 | Disposition: A | Discharge status: Home / Self Care | Payer: 59 | Source: Ambulatory Visit | Attending: Family Medicine | Admitting: Family Medicine

## 2019-03-18 DIAGNOSIS — R1900 Intra-abdominal and pelvic swelling, mass and lump, unspecified site: Secondary | ICD-10-CM

## 2019-03-20 ENCOUNTER — Other Ambulatory Visit: Payer: Self-pay | Admitting: Family Medicine

## 2019-03-20 DIAGNOSIS — Z1231 Encounter for screening mammogram for malignant neoplasm of breast: Secondary | ICD-10-CM

## 2019-05-08 ENCOUNTER — Other Ambulatory Visit: Payer: Self-pay

## 2019-05-08 ENCOUNTER — Ambulatory Visit
Admission: RE | Admit: 2019-05-08 | Discharge: 2019-05-08 | Disposition: A | Discharge status: Home / Self Care | Payer: 59 | Source: Ambulatory Visit | Attending: Family Medicine | Admitting: Family Medicine

## 2019-05-08 DIAGNOSIS — Z1231 Encounter for screening mammogram for malignant neoplasm of breast: Secondary | ICD-10-CM

## 2020-03-27 ENCOUNTER — Other Ambulatory Visit: Payer: Self-pay | Admitting: Family Medicine

## 2020-03-30 ENCOUNTER — Other Ambulatory Visit: Payer: Self-pay | Admitting: Family Medicine

## 2020-03-30 DIAGNOSIS — Z1231 Encounter for screening mammogram for malignant neoplasm of breast: Secondary | ICD-10-CM

## 2020-04-10 ENCOUNTER — Other Ambulatory Visit: Payer: Self-pay | Admitting: Family Medicine

## 2020-04-10 ENCOUNTER — Other Ambulatory Visit (HOSPITAL_COMMUNITY)
Admission: RE | Admit: 2020-04-10 | Discharge: 2020-04-10 | Disposition: A | Discharge status: Home / Self Care | Payer: 59 | Source: Ambulatory Visit | Attending: Family Medicine | Admitting: Family Medicine

## 2020-04-10 DIAGNOSIS — Z124 Encounter for screening for malignant neoplasm of cervix: Secondary | ICD-10-CM | POA: Insufficient documentation

## 2020-04-15 LAB — CYTOLOGY - PAP
Comment: NEGATIVE
Diagnosis: UNDETERMINED — AB
High risk HPV: POSITIVE — AB

## 2020-05-08 ENCOUNTER — Other Ambulatory Visit: Payer: Self-pay

## 2020-05-08 ENCOUNTER — Ambulatory Visit
Admission: RE | Admit: 2020-05-08 | Discharge: 2020-05-08 | Disposition: A | Discharge status: Home / Self Care | Payer: 59 | Source: Ambulatory Visit | Attending: Family Medicine | Admitting: Family Medicine

## 2020-05-08 DIAGNOSIS — Z1231 Encounter for screening mammogram for malignant neoplasm of breast: Secondary | ICD-10-CM

## 2021-03-26 ENCOUNTER — Other Ambulatory Visit: Payer: Self-pay | Admitting: Family Medicine

## 2021-03-26 DIAGNOSIS — Z1231 Encounter for screening mammogram for malignant neoplasm of breast: Secondary | ICD-10-CM

## 2021-05-10 ENCOUNTER — Ambulatory Visit
Admission: RE | Admit: 2021-05-10 | Discharge: 2021-05-10 | Disposition: A | Discharge status: Home / Self Care | Payer: PRIVATE HEALTH INSURANCE | Source: Ambulatory Visit

## 2021-05-10 DIAGNOSIS — Z1231 Encounter for screening mammogram for malignant neoplasm of breast: Secondary | ICD-10-CM

## 2021-07-03 DIAGNOSIS — J029 Acute pharyngitis, unspecified: Secondary | ICD-10-CM | POA: Diagnosis not present

## 2021-07-03 DIAGNOSIS — R509 Fever, unspecified: Secondary | ICD-10-CM | POA: Diagnosis not present

## 2021-07-20 DIAGNOSIS — H524 Presbyopia: Secondary | ICD-10-CM | POA: Diagnosis not present

## 2021-07-20 DIAGNOSIS — H5203 Hypermetropia, bilateral: Secondary | ICD-10-CM | POA: Diagnosis not present

## 2021-08-25 DIAGNOSIS — E7800 Pure hypercholesterolemia, unspecified: Secondary | ICD-10-CM | POA: Diagnosis not present

## 2021-08-25 DIAGNOSIS — Z0000 Encounter for general adult medical examination without abnormal findings: Secondary | ICD-10-CM | POA: Diagnosis not present

## 2021-08-25 DIAGNOSIS — Z124 Encounter for screening for malignant neoplasm of cervix: Secondary | ICD-10-CM | POA: Diagnosis not present

## 2021-08-25 DIAGNOSIS — R030 Elevated blood-pressure reading, without diagnosis of hypertension: Secondary | ICD-10-CM | POA: Diagnosis not present

## 2021-08-25 DIAGNOSIS — Z23 Encounter for immunization: Secondary | ICD-10-CM | POA: Diagnosis not present

## 2021-10-18 DIAGNOSIS — R87610 Atypical squamous cells of undetermined significance on cytologic smear of cervix (ASC-US): Secondary | ICD-10-CM | POA: Diagnosis not present

## 2021-10-18 DIAGNOSIS — R87810 Cervical high risk human papillomavirus (HPV) DNA test positive: Secondary | ICD-10-CM | POA: Diagnosis not present

## 2021-10-19 DIAGNOSIS — R635 Abnormal weight gain: Secondary | ICD-10-CM | POA: Diagnosis not present

## 2021-10-19 DIAGNOSIS — E039 Hypothyroidism, unspecified: Secondary | ICD-10-CM | POA: Diagnosis not present

## 2021-10-19 DIAGNOSIS — E7800 Pure hypercholesterolemia, unspecified: Secondary | ICD-10-CM | POA: Diagnosis not present

## 2021-10-19 DIAGNOSIS — R7309 Other abnormal glucose: Secondary | ICD-10-CM | POA: Diagnosis not present

## 2021-10-19 DIAGNOSIS — N951 Menopausal and female climacteric states: Secondary | ICD-10-CM | POA: Diagnosis not present

## 2021-10-19 DIAGNOSIS — Z6829 Body mass index (BMI) 29.0-29.9, adult: Secondary | ICD-10-CM | POA: Diagnosis not present

## 2021-10-19 DIAGNOSIS — R7989 Other specified abnormal findings of blood chemistry: Secondary | ICD-10-CM | POA: Diagnosis not present

## 2021-10-26 DIAGNOSIS — Z1339 Encounter for screening examination for other mental health and behavioral disorders: Secondary | ICD-10-CM | POA: Diagnosis not present

## 2021-10-26 DIAGNOSIS — Z1331 Encounter for screening for depression: Secondary | ICD-10-CM | POA: Diagnosis not present

## 2021-10-26 DIAGNOSIS — N951 Menopausal and female climacteric states: Secondary | ICD-10-CM | POA: Diagnosis not present

## 2021-10-26 DIAGNOSIS — E7800 Pure hypercholesterolemia, unspecified: Secondary | ICD-10-CM | POA: Diagnosis not present

## 2021-10-26 DIAGNOSIS — R635 Abnormal weight gain: Secondary | ICD-10-CM | POA: Diagnosis not present

## 2021-10-26 DIAGNOSIS — Z6829 Body mass index (BMI) 29.0-29.9, adult: Secondary | ICD-10-CM | POA: Diagnosis not present

## 2021-10-28 DIAGNOSIS — E039 Hypothyroidism, unspecified: Secondary | ICD-10-CM | POA: Diagnosis not present

## 2021-10-28 DIAGNOSIS — Z6829 Body mass index (BMI) 29.0-29.9, adult: Secondary | ICD-10-CM | POA: Diagnosis not present

## 2021-11-22 DIAGNOSIS — E7800 Pure hypercholesterolemia, unspecified: Secondary | ICD-10-CM | POA: Diagnosis not present

## 2021-11-22 DIAGNOSIS — Z23 Encounter for immunization: Secondary | ICD-10-CM | POA: Diagnosis not present

## 2022-03-09 DIAGNOSIS — L814 Other melanin hyperpigmentation: Secondary | ICD-10-CM | POA: Diagnosis not present

## 2022-03-09 DIAGNOSIS — D229 Melanocytic nevi, unspecified: Secondary | ICD-10-CM | POA: Diagnosis not present

## 2022-03-09 DIAGNOSIS — L821 Other seborrheic keratosis: Secondary | ICD-10-CM | POA: Diagnosis not present

## 2022-03-09 DIAGNOSIS — L578 Other skin changes due to chronic exposure to nonionizing radiation: Secondary | ICD-10-CM | POA: Diagnosis not present

## 2022-06-27 IMAGING — MG MM DIGITAL SCREENING BILAT W/ TOMO AND CAD
8 series · 8 of 24 positions shown · non-contrast
Comparison: Previous exam(s).

CLINICAL DATA: Screening.

EXAM:
DIGITAL SCREENING BILATERAL MAMMOGRAM WITH TOMOSYNTHESIS AND CAD
TECHNIQUE: Bilateral screening digital craniocaudal and mediolateral oblique
mammograms were obtained. Bilateral screening digital breast
tomosynthesis was performed. The images were evaluated with
computer-aided detection.

[R MLO synth-2D]
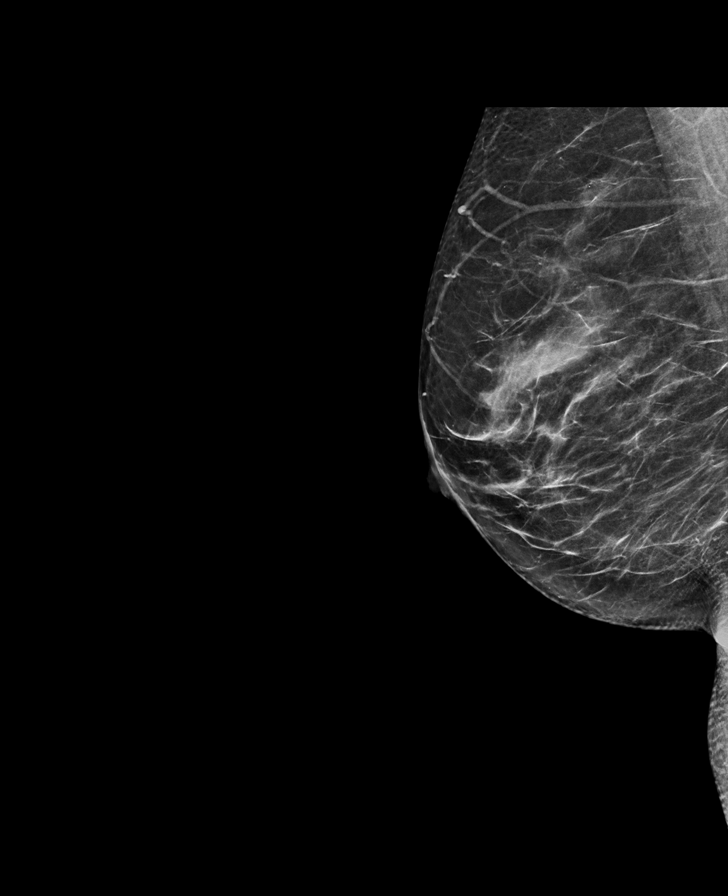

[L MLO synth-2D]
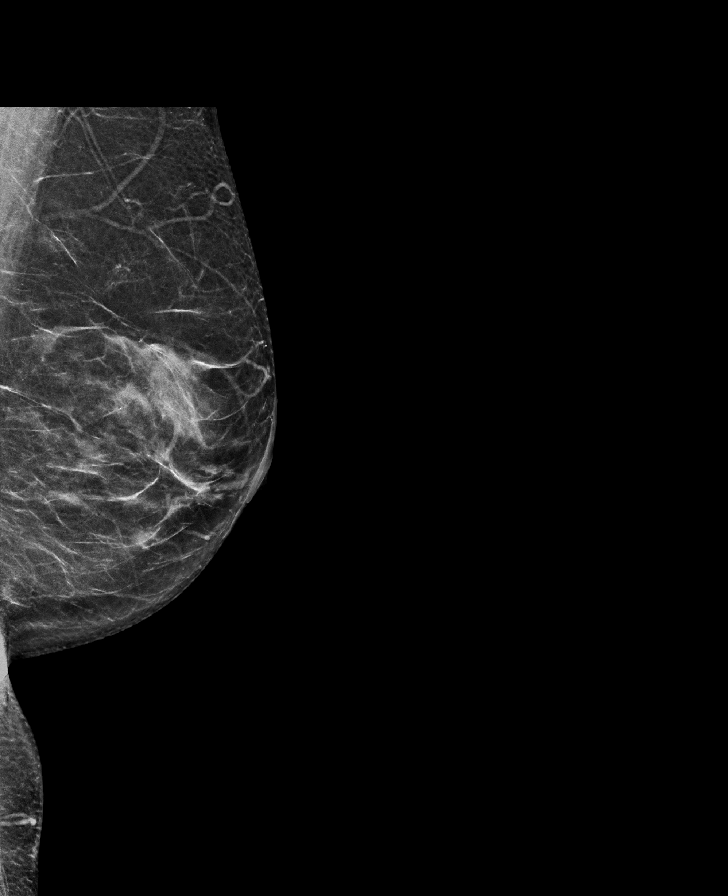

[R CC synth-2D]
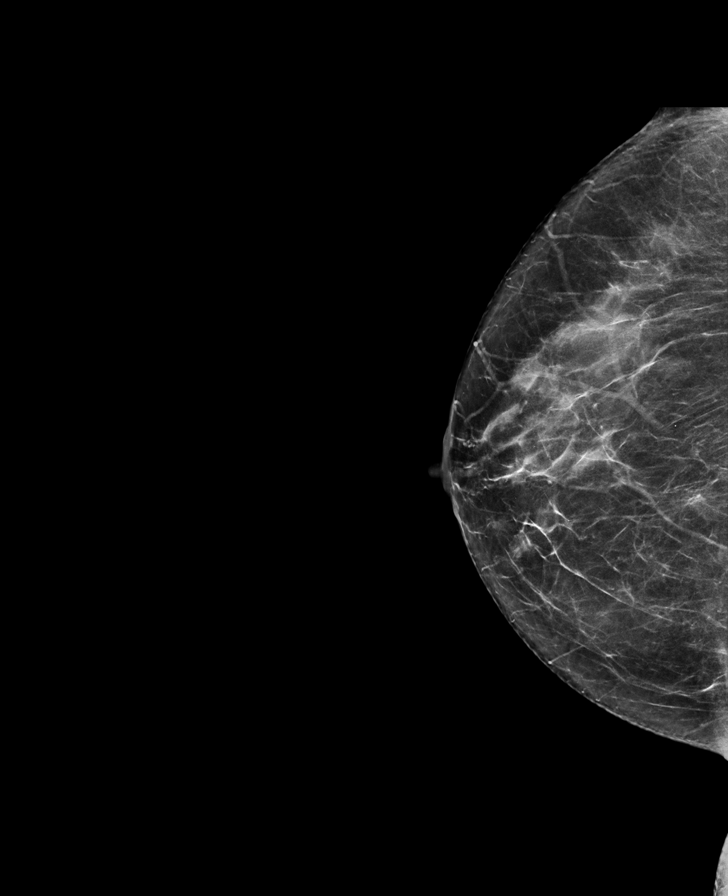

[L CC synth-2D]
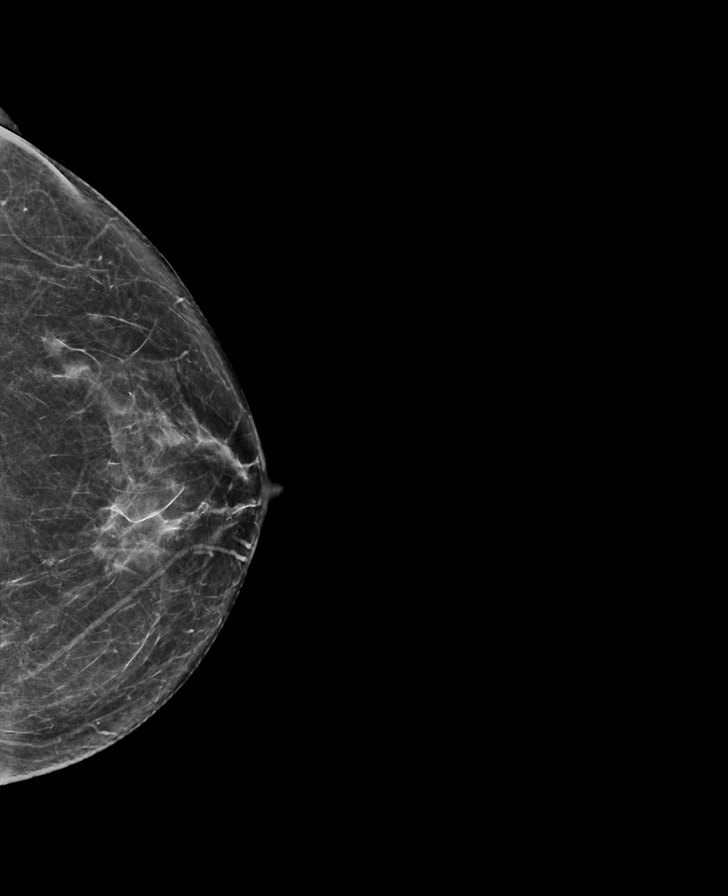

[L CC tomo · tomo slice 33/66.0]
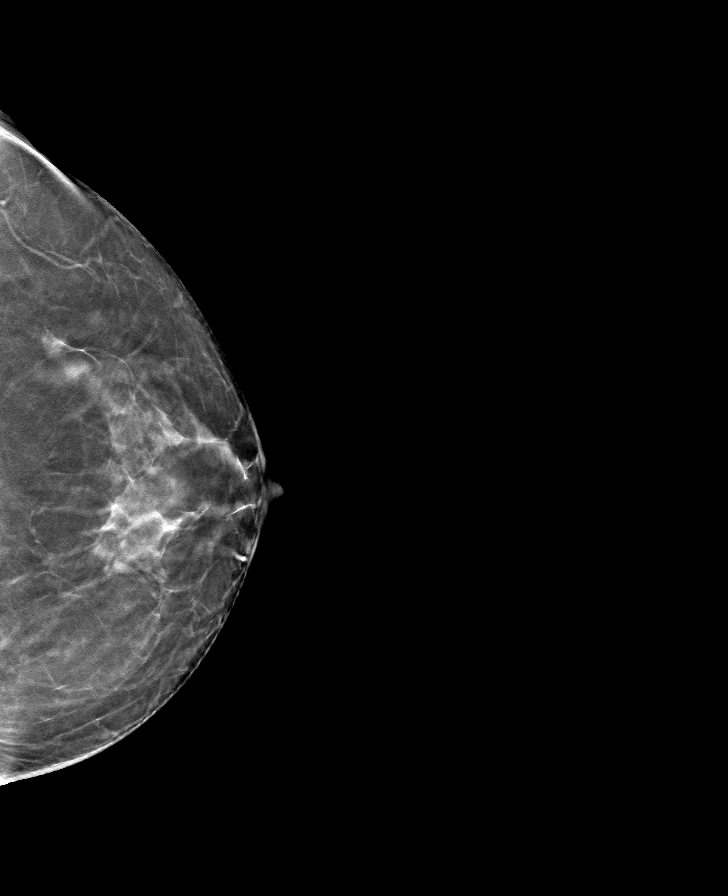

[R CC tomo · tomo slice 35/68.0]
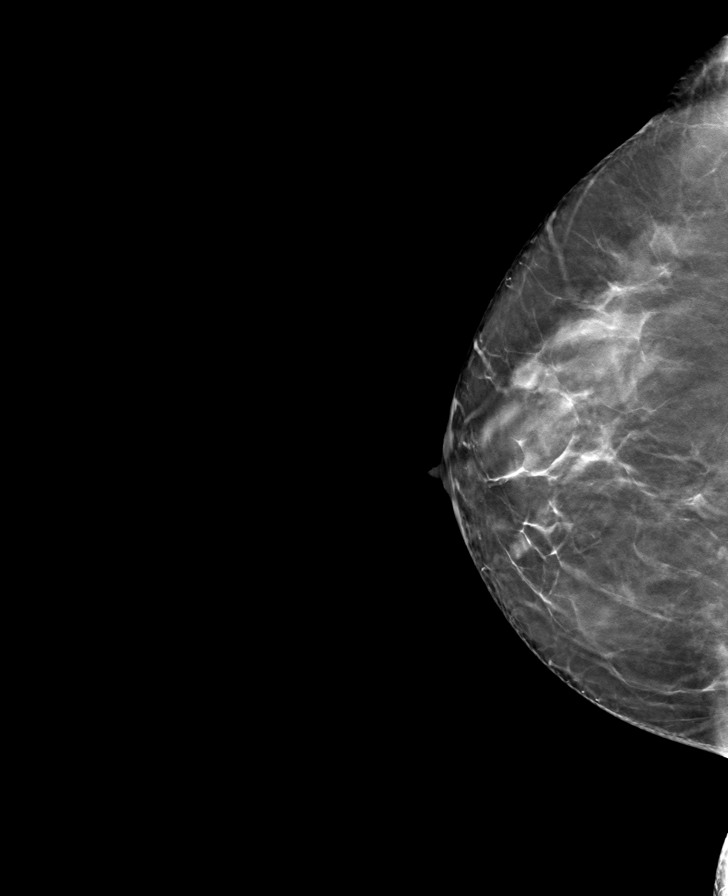

[L MLO tomo · tomo slice 33/64.0]
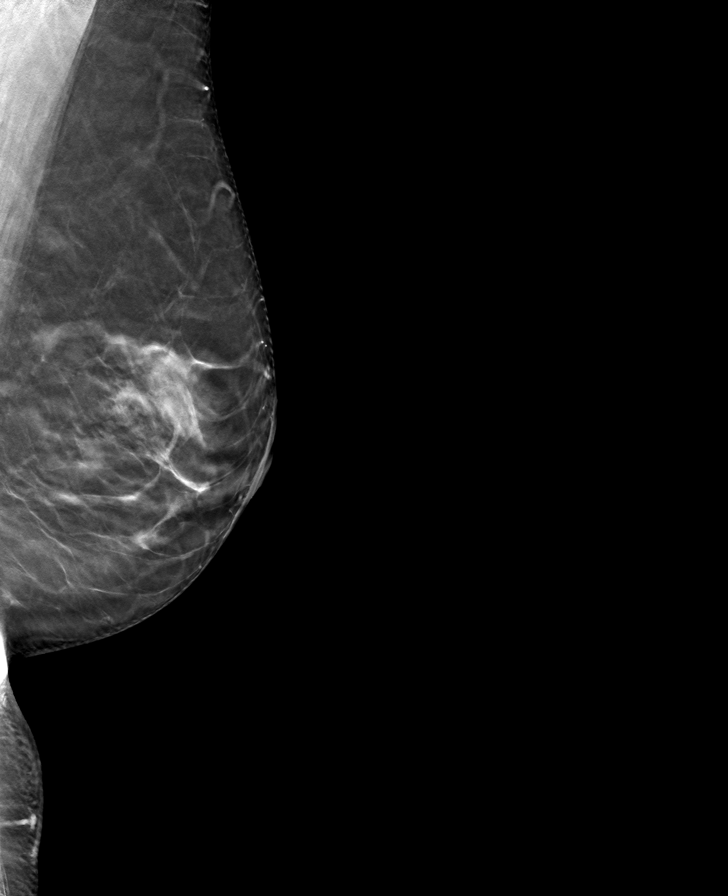

[R MLO tomo · tomo slice 35/69.0]
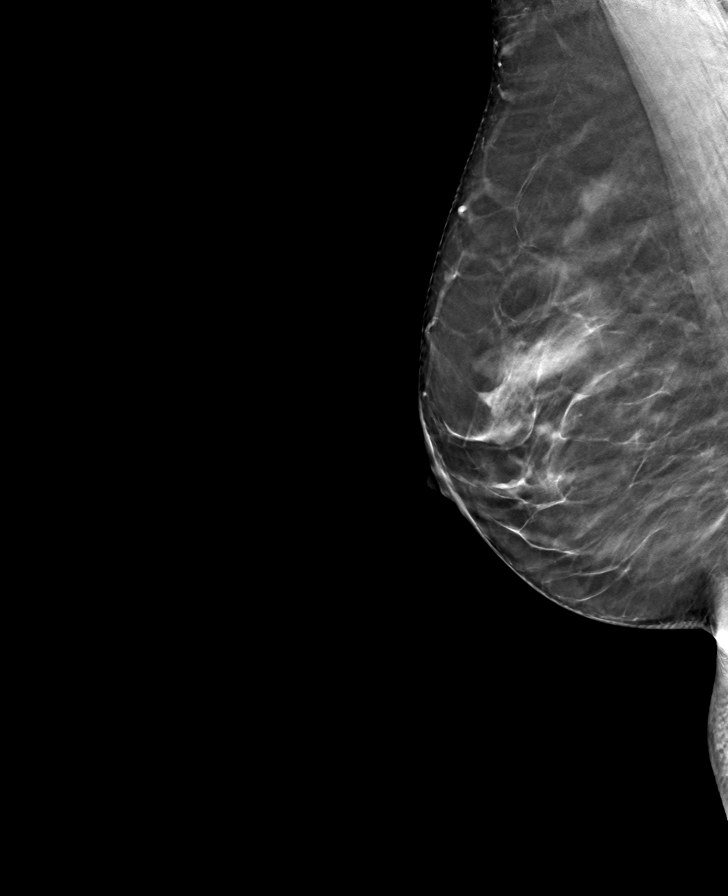

[8 of 24 positions shown; findings below may reference images not displayed]

ACR Breast Density Category c: The breast tissue is heterogeneously
dense, which may obscure small masses.
FINDINGS: There are no findings suspicious for malignancy.
IMPRESSION: No mammographic evidence of malignancy. A result letter of this
screening mammogram will be mailed directly to the patient.

RECOMMENDATION:
Screening mammogram in one year. (Code:Q3-W-BC3)

BI-RADS CATEGORY  1: Negative.

## 2022-07-04 ENCOUNTER — Other Ambulatory Visit: Payer: Self-pay | Admitting: Family Medicine

## 2022-07-04 DIAGNOSIS — Z1231 Encounter for screening mammogram for malignant neoplasm of breast: Secondary | ICD-10-CM

## 2022-07-13 ENCOUNTER — Ambulatory Visit
Admission: RE | Admit: 2022-07-13 | Discharge: 2022-07-13 | Disposition: A | Discharge status: Home / Self Care | Payer: BC Managed Care – PPO | Source: Ambulatory Visit | Attending: Family Medicine | Admitting: Family Medicine

## 2022-07-13 DIAGNOSIS — Z1231 Encounter for screening mammogram for malignant neoplasm of breast: Secondary | ICD-10-CM | POA: Diagnosis not present

## 2022-09-05 ENCOUNTER — Ambulatory Visit (INDEPENDENT_AMBULATORY_CARE_PROVIDER_SITE_OTHER): Payer: BC Managed Care – PPO | Admitting: Podiatry

## 2022-09-05 ENCOUNTER — Ambulatory Visit (INDEPENDENT_AMBULATORY_CARE_PROVIDER_SITE_OTHER): Payer: BC Managed Care – PPO

## 2022-09-05 DIAGNOSIS — M7662 Achilles tendinitis, left leg: Secondary | ICD-10-CM | POA: Diagnosis not present

## 2022-09-05 DIAGNOSIS — M722 Plantar fascial fibromatosis: Secondary | ICD-10-CM

## 2022-09-05 MED ORDER — TRIAMCINOLONE ACETONIDE 10 MG/ML IJ SUSP
10.0000 mg | Freq: Once | INTRAMUSCULAR | Status: AC
  Administered 2022-09-05: 10 mg

## 2022-09-05 MED ORDER — DICLOFENAC SODIUM 75 MG PO TBEC: 75.0000 mg | DELAYED_RELEASE_TABLET | Freq: Two times a day (BID) | ORAL | 2 refills | Status: AC

## 2022-09-05 NOTE — Patient Instructions (Signed)

## 2022-09-07 NOTE — Progress Notes (Signed)
Subjective:   Patient ID: Colleen Burke, female   DOB: 57 y.o.   MRN: [id number]   HPI Patient states she has had a number of months of exquisite discomfort in the back of the left heel which is gradually become more of an issue for her.  She likes to be active and this is impeding this and she plays tennis and she does other sports and is having trouble doing it.  Patient does not smoke likes to be active   Review of Systems  All other systems reviewed and are negative.       Objective:  Physical Exam Vitals and nursing note reviewed.  Constitutional:      Appearance: She is well-developed.  Pulmonary:     Effort: Pulmonary effort is normal.  Musculoskeletal:        General: Normal range of motion.  Skin:    General: Skin is warm.  Neurological:     Mental Status: She is alert.     Neurovascular status intact muscle strength found to be adequate range of motion adequate with slight splinting on the left when the Achilles equinus condition was tested.  There is quite a bit of inflammation in the posterior medial aspect of the left Achilles insertion into the calcaneus with swelling moderate warmth and fluid buildup no drainage noted     Assessment:  Acute Achilles tendinitis left with inflammation mostly on the medial side     Plan:  H&P reviewed x-ray educated her on condition.  At this point I did do after reviewing injections and their risk of rupture a careful injection of the medial side 3 mg dexamethasone Kenalog 5 mg Xylocaine and I immobilized with air fracture walker to completely let it rest.  Patient will take at least a week or 2 off of tennis and then if things are better may slowly return but I want her wearing the boot at all other times and she still may not be able to play tennis right away.  Hopefully this will solve the problem also placed on diclofenac 75 mg twice daily  X-rays indicate minimal spurring on the posterior heel appears to be more  inflammatory

## 2022-09-26 ENCOUNTER — Encounter: Payer: Self-pay | Admitting: Podiatry

## 2022-09-26 ENCOUNTER — Ambulatory Visit (INDEPENDENT_AMBULATORY_CARE_PROVIDER_SITE_OTHER): Payer: BC Managed Care – PPO | Admitting: Podiatry

## 2022-09-26 DIAGNOSIS — M7662 Achilles tendinitis, left leg: Secondary | ICD-10-CM | POA: Diagnosis not present

## 2022-09-27 NOTE — Progress Notes (Signed)
Subjective:   Patient ID: Colleen Burke, female   DOB: 57 y.o.   MRN: [id number]   HPI Patient states that he will is feeling quite a bit better with only minimal discomfort at this time   ROS      Objective:  Physical Exam  Neurovascular status intact significant diminishment of discomfort plantar heel left fluid buildup still noted but a lot better than it was previously     Assessment:  Acute fasciitis left that has improved quite dramatically     Plan:  H&P reviewed went ahead today and advised on the utilization of physical therapy at home and also she gear modifications to be done.  If this keeps symptoms good patient does not need to be seen or have other treatments but if they do continue to flare we will have to look at some other alternatives

## 2022-10-20 DIAGNOSIS — Z01419 Encounter for gynecological examination (general) (routine) without abnormal findings: Secondary | ICD-10-CM | POA: Diagnosis not present

## 2022-10-20 DIAGNOSIS — Z124 Encounter for screening for malignant neoplasm of cervix: Secondary | ICD-10-CM | POA: Diagnosis not present

## 2023-03-02 DIAGNOSIS — Z23 Encounter for immunization: Secondary | ICD-10-CM | POA: Diagnosis not present

## 2023-03-02 DIAGNOSIS — R739 Hyperglycemia, unspecified: Secondary | ICD-10-CM | POA: Diagnosis not present

## 2023-03-02 DIAGNOSIS — E7800 Pure hypercholesterolemia, unspecified: Secondary | ICD-10-CM | POA: Diagnosis not present

## 2023-03-02 DIAGNOSIS — R030 Elevated blood-pressure reading, without diagnosis of hypertension: Secondary | ICD-10-CM | POA: Diagnosis not present

## 2023-03-17 DIAGNOSIS — R946 Abnormal results of thyroid function studies: Secondary | ICD-10-CM | POA: Diagnosis not present

## 2023-05-02 DIAGNOSIS — E039 Hypothyroidism, unspecified: Secondary | ICD-10-CM | POA: Diagnosis not present

## 2023-05-22 DIAGNOSIS — L821 Other seborrheic keratosis: Secondary | ICD-10-CM | POA: Diagnosis not present

## 2023-05-22 DIAGNOSIS — L578 Other skin changes due to chronic exposure to nonionizing radiation: Secondary | ICD-10-CM | POA: Diagnosis not present

## 2023-05-22 DIAGNOSIS — D229 Melanocytic nevi, unspecified: Secondary | ICD-10-CM | POA: Diagnosis not present

## 2023-05-22 DIAGNOSIS — L814 Other melanin hyperpigmentation: Secondary | ICD-10-CM | POA: Diagnosis not present

## 2023-05-25 DIAGNOSIS — H5203 Hypermetropia, bilateral: Secondary | ICD-10-CM | POA: Diagnosis not present

## 2023-05-25 DIAGNOSIS — H04123 Dry eye syndrome of bilateral lacrimal glands: Secondary | ICD-10-CM | POA: Diagnosis not present

## 2023-06-22 DIAGNOSIS — E039 Hypothyroidism, unspecified: Secondary | ICD-10-CM | POA: Diagnosis not present

## 2023-08-17 ENCOUNTER — Other Ambulatory Visit: Payer: Self-pay | Admitting: Family Medicine

## 2023-08-17 DIAGNOSIS — Z1231 Encounter for screening mammogram for malignant neoplasm of breast: Secondary | ICD-10-CM

## 2023-08-18 DIAGNOSIS — Z792 Long term (current) use of antibiotics: Secondary | ICD-10-CM | POA: Diagnosis not present

## 2023-08-18 DIAGNOSIS — E039 Hypothyroidism, unspecified: Secondary | ICD-10-CM | POA: Diagnosis not present

## 2023-08-18 DIAGNOSIS — Z7184 Encounter for health counseling related to travel: Secondary | ICD-10-CM | POA: Diagnosis not present

## 2023-08-25 ENCOUNTER — Ambulatory Visit
Admission: RE | Admit: 2023-08-25 | Discharge: 2023-08-25 | Disposition: A | Discharge status: Home / Self Care | Source: Ambulatory Visit | Attending: Family Medicine | Admitting: Family Medicine

## 2023-08-25 DIAGNOSIS — Z1231 Encounter for screening mammogram for malignant neoplasm of breast: Secondary | ICD-10-CM

## 2023-10-13 DIAGNOSIS — E039 Hypothyroidism, unspecified: Secondary | ICD-10-CM | POA: Diagnosis not present

## 2023-10-13 DIAGNOSIS — Z23 Encounter for immunization: Secondary | ICD-10-CM | POA: Diagnosis not present

## 2023-10-13 DIAGNOSIS — Z0000 Encounter for general adult medical examination without abnormal findings: Secondary | ICD-10-CM | POA: Diagnosis not present

## 2023-10-13 DIAGNOSIS — E7800 Pure hypercholesterolemia, unspecified: Secondary | ICD-10-CM | POA: Diagnosis not present

## 2023-11-03 DIAGNOSIS — Z23 Encounter for immunization: Secondary | ICD-10-CM | POA: Diagnosis not present

## 2023-11-07 DIAGNOSIS — Z01419 Encounter for gynecological examination (general) (routine) without abnormal findings: Secondary | ICD-10-CM | POA: Diagnosis not present

## 2023-11-07 DIAGNOSIS — Z124 Encounter for screening for malignant neoplasm of cervix: Secondary | ICD-10-CM | POA: Diagnosis not present

## 2024-04-11 DIAGNOSIS — L821 Other seborrheic keratosis: Secondary | ICD-10-CM | POA: Diagnosis not present

## 2024-04-11 DIAGNOSIS — D229 Melanocytic nevi, unspecified: Secondary | ICD-10-CM | POA: Diagnosis not present

## 2024-04-11 DIAGNOSIS — L814 Other melanin hyperpigmentation: Secondary | ICD-10-CM | POA: Diagnosis not present

## 2024-05-13 DIAGNOSIS — D123 Benign neoplasm of transverse colon: Secondary | ICD-10-CM | POA: Diagnosis not present

## 2024-05-13 DIAGNOSIS — Z09 Encounter for follow-up examination after completed treatment for conditions other than malignant neoplasm: Secondary | ICD-10-CM | POA: Diagnosis not present

## 2024-05-13 DIAGNOSIS — D122 Benign neoplasm of ascending colon: Secondary | ICD-10-CM | POA: Diagnosis not present

## 2024-05-13 DIAGNOSIS — Z860101 Personal history of adenomatous and serrated colon polyps: Secondary | ICD-10-CM | POA: Diagnosis not present
